# Patient Record
Sex: Male | Born: 1995 | Race: White | Hispanic: No | Marital: Single | Smoking: Never smoker
Health system: Southern US, Community
[De-identification: ages and names within clinical notes are randomized; demographics above are authoritative.]

## PROBLEM LIST (undated history)

## (undated) DIAGNOSIS — F909 Attention-deficit hyperactivity disorder, unspecified type: Secondary | ICD-10-CM

## (undated) HISTORY — PX: KNEE ARTHROSCOPY: SHX127

## (undated) HISTORY — DX: Attention-deficit hyperactivity disorder, unspecified type: F90.9

---
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
## 2013-01-24 ENCOUNTER — Other Ambulatory Visit: Payer: Self-pay

## 2014-01-02 ENCOUNTER — Ambulatory Visit (HOSPITAL_COMMUNITY)
Admission: RE | Admit: 2014-01-02 | Discharge: 2014-01-02 | Disposition: A | Discharge status: Home / Self Care | Payer: 59 | Source: Ambulatory Visit | Attending: Internal Medicine | Admitting: Internal Medicine

## 2014-01-02 VITALS — BP 116/68 | HR 85 | Wt 167.0 lb

## 2014-01-02 DIAGNOSIS — I319 Disease of pericardium, unspecified: Secondary | ICD-10-CM | POA: Insufficient documentation

## 2014-01-02 DIAGNOSIS — I309 Acute pericarditis, unspecified: Secondary | ICD-10-CM

## 2014-01-02 DIAGNOSIS — Z025 Encounter for examination for participation in sport: Secondary | ICD-10-CM

## 2014-01-02 LAB — TROPONIN I: Troponin I: <0.3 ng/mL (ref <0.30)

## 2014-01-02 MED ORDER — COLCHICINE 0.6 MG PO TABS: 0.6000 mg | ORAL_TABLET | Freq: Two times a day (BID) | ORAL | Status: AC

## 2014-01-02 NOTE — Progress Notes (Addendum)
ADVANCED HEART FAILURE CONSULT NOTE  Patient ID: William GalasKevin M Beltran, male   DOB: 19-May-1995, 18 y.o.   MRN: [id number]  HPI:  William Beltran is a 18 y/o male with no significant PMHx. 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 Presents for further evaluation of recent diagnosis of myopericarditis.   He was playing basketball last Friday and developed shortness of breath and pleuritic CP. The symptoms resolved when he stopped playing. The symptoms then recurred on Saturday and Sunday with activity. Any time he would exert himself developed pleuritic CP and SOB. Once he stopped exerting himself the symptoms resolved. He felt he was wheezing. But he denies an h/o exertional asthma.  No productive cough, no fevers or chills. No pain on lying down. Several weeks before had a URI. No rash, long travel, tick bites, arthalgias. No evidence of STDs.   [place] hospital in [place], [place] and had very thorough work-up. Troponin minimally elevated at 0.089 and 0.093. D-dimer ordered but result not available. Initial ECG normal. F/u ECG on 10/13 mild diffuse ST elevation and PR depression. (I have reviewed these personally - ECGs read as negative but I believe 2nd ECG shows clear signs of pericarditis)  Echo 55-60% Mild prolapse of anterior MV leaflet Trace MR. No effusion. Underwent maximal exercise tress test going into stage 5 of Bruce. No arrhythmias. No ST changes reported. Denies CP but said he felt like he may have had some if he went further. Stress echo which was normal.    Diagnosed with myopericarditis. Started ASA, ibuprofen and colchicine. Told not to compete in competitive sports for 6 months. Has only taken a couple doses of ibuprofen. Nothing else. No further symptoms. No palpitations, CP or DOE. Family brought him here for 2nd opinion.    Review of Systems: [y] = yes, [ ]  = no   General: Weight gain [ ] ; Weight loss [ ] ; Anorexia [ ] ; Fatigue [ ] ; Fever [ ] ; Chills [ ] ;  Weakness [ ]   Cardiac: Chest pain/pressure Cove.Etienne[y ]; Resting SOB [ ] ; Exertional SOB Cove.Etienne[y ]; Orthopnea [ ] ; Pedal Edema [ ] ; Palpitations [ ] ; Syncope [ ] ; Presyncope [ ] ; Paroxysmal nocturnal dyspnea[ ]   Pulmonary: Cough [ ] ; Wheezing[ ] ; Hemoptysis[ ] ; Sputum [ ] ; Snoring [ ]   GI: Vomiting[ ] ; Dysphagia[ ] ; Melena[ ] ; Hematochezia [ ] ; Heartburn[ ] ; Abdominal pain [ ] ; Constipation [ ] ; Diarrhea [ ] ; BRBPR [ ]   GU: Hematuria[ ] ; Dysuria [ ] ; Nocturia[ ]   Vascular: Pain in legs with walking [ ] ; Pain in feet with lying flat [ ] ; Non-healing sores [ ] ; Stroke [ ] ; TIA [ ] ; Slurred speech [ ] ;  Neuro: Headaches[ ] ; Vertigo[ ] ; Seizures[ ] ; Paresthesias[ ] ;Blurred vision [ ] ; Diplopia [ ] ; Vision changes [ ]   Ortho/Skin: Arthritis [ ] ; Joint pain [ ] ; Muscle pain [ ] ; Joint swelling [ ] ; Back Pain [ ] ; Rash [ ]   Psych: Depression[ ] ; Anxiety[ ]   Heme: Bleeding problems [ ] ; Clotting disorders [ ] ; Anemia [ ]   Endocrine: Diabetes [ ] ; Thyroid dysfunction[ ]    Past Medical History  Diagnosis Date  . ADHD (attention deficit hyperactivity disorder)     Current Outpatient Prescriptions  Medication Sig Dispense Refill  . ibuprofen (ADVIL,MOTRIN) 200 MG tablet Take 200 mg by mouth every 6 (six) hours as needed.       No current facility-administered medications for this encounter.    No Known Allergies  History   Social History  . Marital Status: Single    Spouse Name: N/A    Number of Children: N/A  . Years of Education: N/A   Occupational History  . Not on file.   Social History Main Topics  . Smoking status: Never Smoker   . Smokeless tobacco: Not on file  . Alcohol Use: No  . Drug Use: No  . Sexual Activity:    Other Topics Concern  . Not on file   Social History Narrative  . No narrative on file   Soc Hx addendum: [employer] student. Non-smoker. No drugs or excessive ETOH   Family History  Problem Relation Age of Onset  . Coronary artery disease Paternal Grandfather   .  Hypertension Maternal Grandmother   . Diabetes Maternal Grandmother   . Diabetes Maternal Uncle   . Stroke Paternal Grandfather   . Colon cancer Maternal Grandmother     Filed Vitals:   01/02/14 1504  BP: 116/68  Pulse: 85  Weight: 167 lb (75.751 kg)  SpO2: 99%    PHYSICAL EXAM: General:  Well appearing. Athletic. No respiratory difficulty HEENT: normal Neck: supple. no JVD. Carotids 2+ bilat; no bruits. No lymphadenopathy or thryomegaly appreciated. Cor: PMI nondisplaced. Regular rate & rhythm. No rubs, gallops or murmurs. Lungs: clear Abdomen: soft, nontender, nondistended. No hepatosplenomegaly. No bruits or masses. Good bowel sounds. Extremities: no cyanosis, clubbing, rash, edema Neuro: alert & oriented x 3, cranial nerves grossly intact. moves all 4 extremities w/o difficulty. Affect pleasant.  ECG: Normal sinus rhythm 72 No ST-T wave abnormalities. Narrow QRS. Normal intervals.    ASSESSMENT:  1) Myopericarditis, likely post-viral.  PLAN/DISCUSSION:  Although his symptoms were somewhat atypical, I agree that he likely had a very mild, self-limiting case of myopericarditis with a minimally elevated troponin, normal echo and normal maximal stress test. His symptoms have now resolved and his troponin is completely normal. I have advised him to take ibuprofen 600 mg bid for several more days and to complete a 4155-month course of colchicine 1 tab bid to reduce likelihood of developing chronic or recurrent pericarditis. He is to contact me immediately if symptoms recur.  Most of the time of our visit was spent discussing his candidacy to return to competitive sports. Published expert recommendations (reviewed in UpTo Date) suggest a 7655-month wait for pericarditis and 6023-month wait post myopericarditis. Given the mild, self-limiting nature of his case and the fact he has had a normal echo and normal maximum stress test, I think these recommendations may be too stringent in his  situation. I think if he remains symptom free for the next month and has a normal echo in 3-4 weeks he likely can return to competitive sports in one month. That said, I printed out the recommendations for his family and made it clear to them that my recommendation was outside of the standard recommendations because I feel his case was very mild and he has already had a negative maximal stress test with minimal, if any, myocardial involvement. I told them I would clear him to play competitive sports in one month with the realization that there may be a very small risk of adverse cardiac events, including sudden cardiac death, that they would have to acknowledge and accept before we could recommend return to play on a schedule ahead of what expert published opinion has recommended. In the interim, I have told them that it would be acceptable for him to engage in mild to moderate activity,  such as shooting drills, as tolerated but he is to avoid strenuous aerobic activity (sprints, running full court, etc) or weight lifting until that point.   They will speak with their team physician and get back to me.   Truman Hayward 6:34 PM

## 2014-01-02 NOTE — Patient Instructions (Signed)
Labs today  START Colchicine 0.6mg  twice a day for the next 3 months. CONTINUE Ibuprofen 600mg  twice a day for another week.    Please feel free to contact our office as needed for follow up.

## 2014-01-05 DIAGNOSIS — I309 Acute pericarditis, unspecified: Secondary | ICD-10-CM | POA: Insufficient documentation

## 2014-01-06 NOTE — Addendum Note (Signed)
Encounter addended by: Ernestina PennaAlysia M Roberts, CCT on: 01/06/2014  9:15 AM<BR>     Documentation filed: Charges VN

## 2014-01-06 NOTE — Addendum Note (Signed)
Encounter addended by: Ernestina PennaAlysia M Roberts, CCT on: 01/06/2014  9:06 AM<BR>     Documentation filed: Charges VN

## 2014-01-20 ENCOUNTER — Telehealth (HOSPITAL_COMMUNITY): Payer: Self-pay | Admitting: *Deleted

## 2014-01-20 NOTE — Telephone Encounter (Signed)
Pt's dad called stating that pt needed his note updated to state he was ok to start light drills, Dr Gala RomneyBensimhon addended note, pt's dad aware and note faxed to pt's training room at [phone]

## 2014-01-20 NOTE — Addendum Note (Signed)
Encounter addended by: Dolores Pattyaniel R Zemira Zehring, MD on: 01/20/2014 12:08 PM<BR>     Documentation filed: Notes Section

## 2014-01-24 ENCOUNTER — Telehealth (HOSPITAL_COMMUNITY): Payer: Self-pay | Admitting: Vascular Surgery

## 2014-01-24 ENCOUNTER — Ambulatory Visit (HOSPITAL_COMMUNITY)
Admission: RE | Admit: 2014-01-24 | Discharge: 2014-01-24 | Disposition: A | Discharge status: Home / Self Care | Payer: 59 | Source: Ambulatory Visit | Attending: Internal Medicine | Admitting: Internal Medicine

## 2014-01-24 DIAGNOSIS — I309 Acute pericarditis, unspecified: Secondary | ICD-10-CM

## 2014-01-24 DIAGNOSIS — I319 Disease of pericardium, unspecified: Secondary | ICD-10-CM | POA: Diagnosis not present

## 2014-01-24 DIAGNOSIS — I340 Nonrheumatic mitral (valve) insufficiency: Secondary | ICD-10-CM | POA: Insufficient documentation

## 2014-01-24 DIAGNOSIS — I059 Rheumatic mitral valve disease, unspecified: Secondary | ICD-10-CM

## 2014-01-24 NOTE — Telephone Encounter (Signed)
Encounter open in error 

## 2014-01-24 NOTE — Progress Notes (Signed)
  Echocardiogram 2D Echocardiogram has been performed.  Leta JunglingCooper, Jeidi Gilles M 01/24/2014, 8:48 AM

## 2014-01-27 ENCOUNTER — Telehealth (HOSPITAL_COMMUNITY): Payer: Self-pay | Admitting: Vascular Surgery

## 2014-01-27 NOTE — Telephone Encounter (Signed)
Pt dad called wanting Echo results.. Please advise

## 2014-01-28 NOTE — Telephone Encounter (Signed)
William PotashAli Cosgrove, William Beltran spoke w/pt's mother and gave her echo results, she states they need a letter stating pt is cleared to play basketball, the pt is signing a wavier for the team accepting the risk, will send to Dr Gala RomneyBensimhon

## 2014-01-29 ENCOUNTER — Encounter (HOSPITAL_COMMUNITY): Payer: Self-pay | Admitting: Internal Medicine

## 2014-01-31 NOTE — Telephone Encounter (Signed)
Letter completed by Dr Gala RomneyBensimhon and faxed to Milas GainJim McLaughlin, athletic director at [phone], copy also emailed to him and pt's dad

## 2014-02-05 ENCOUNTER — Telehealth (HOSPITAL_COMMUNITY): Payer: Self-pay | Admitting: Cardiology

## 2014-02-05 ENCOUNTER — Encounter (HOSPITAL_COMMUNITY): Payer: Self-pay | Admitting: Cardiology

## 2014-02-05 NOTE — Telephone Encounter (Signed)
pts father called to request edits to letter written on 01/29/14 by Dr.Bensimhon  Edits completed in letter, printed off for signature from Dr.Bensimhon if he is agreeable to letter/edits

## 2014-02-25 NOTE — Telephone Encounter (Signed)
Provider was not agreeable to changes/edits This concern was addressed by other means
